# Patient Record
Sex: Female | Born: 1990
Health system: Southern US, Community
[De-identification: ages and names within clinical notes are randomized; demographics above are authoritative.]

---
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
## 2008-03-11 ENCOUNTER — Encounter: Admission: RE | Admit: 2008-03-11 | Discharge: 2008-03-11 | Payer: Self-pay | Admitting: Endocrinology

## 2008-03-18 ENCOUNTER — Encounter: Admission: RE | Admit: 2008-03-18 | Discharge: 2008-03-18 | Payer: Self-pay | Admitting: Endocrinology

## 2008-04-18 IMAGING — NM NM RAI THERAPY FOR HYPERTHYROIDISM
1 series · 1 of 1 positions shown · non-contrast
Comparison: none

CLINICAL DATA: TSH less than 0.004.  24-hour RAI uptake 82.3%, Graves disease.
 RADIOACTIVE IODINE THERAPY FOR HYPERTHYROIDISM:
 Procedure:  The risks and benefits of radioactive iodine therapy were discussed with the patient in detail.  Alternative therapies were also mentioned.  Radiation safety was discussed with the patient, including how to protect the general public from exposure.  There were no barriers to communication.  Written consent was obtained.  The patient then received a capsule containing the radiopharmaceutical.  ([id number])
 The patient will follow-up with the referring physician.
 Radiopharmaceutical:  14.97 mCi O-EBE sodium iodide.

[st statics, dual detec · 1 of 1 slices shown]
[im 1/1]
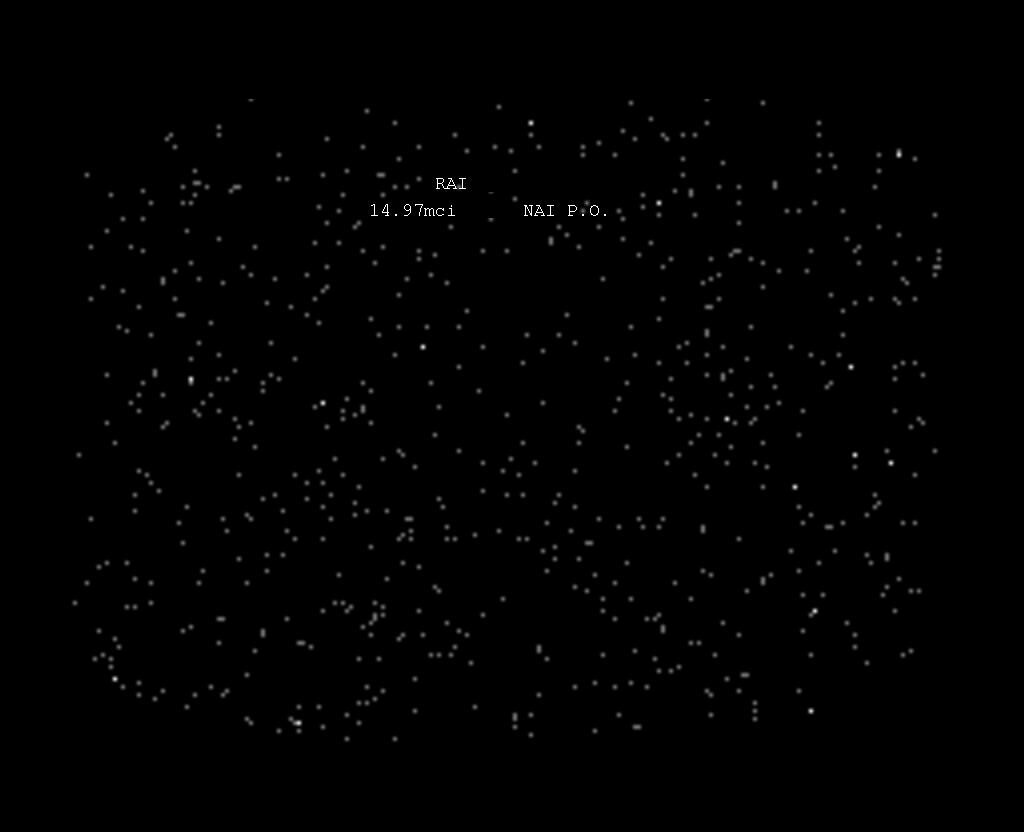

[1 of 1 positions shown; findings below may reference images not displayed]

IMPRESSION: Per oral administration of O-EBE sodium iodide for the treatment of hyperthyroidism.

## 2008-11-21 IMAGING — NM NM THYROID UPTAKE SINGLE (24 HR)
1 series · 1 of 1 positions shown · non-contrast
Comparison: 07/22/2007

CLINICAL DATA: Graves' disease.  The patient underwent F-COC
treatment for hyperthyroidism on 08/08/2007

NUCLEAR MEDICINE 24 HOUR THYROID UPTAKE
TECHNIQUE: Following  per oral administered of F-COC, thyroid
radioiodine uptake was calculated at  24 hours.
Radiopharmaceutical: 12 microcuries F-COC orally

[st statics, dual detec · 1 of 1 slices shown]
[im 1/1]
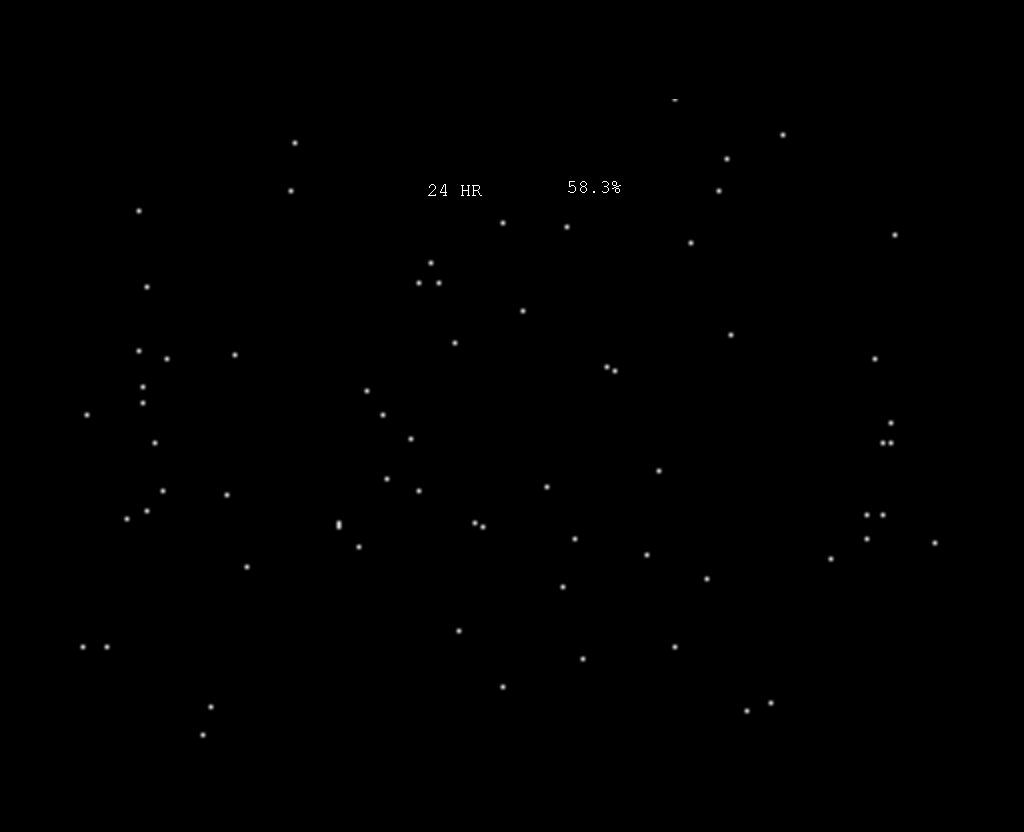

[1 of 1 positions shown; findings below may reference images not displayed]

FINDINGS: Radioactive iodine uptake remains elevated at 58% on
today's study.  Uptake was measured at about 80% on the previous
exam.
IMPRESSION: Elevated radioactive iodine thyroid uptake.

## 2008-11-27 IMAGING — NM NM RAI THERAPY FOR HYPERTHYROIDISM
1 series · 1 of 1 positions shown · non-contrast
Comparison: 08/08/2007

CLINICAL DATA: Graves' disease.  Read treatment.

NUCLEAR MEDICINE RADIOACTIVE IODINE THERAPY FOR HYPERTHYROIDISM
TECHNIQUE: The risks and benefits of radioactive iodine therapy
were discussed with the patient in detail. Alternative therapies
were also mentioned. Radiation safety was discussed with the
patient, including how to protect the general public from exposure.
There were no barriers to communication.  Written consent was
obtained.  The patient then received a capsule containing the
radiopharmaceutical.  The patient will follow-up with the referring
physician.
Radiopharmaceutical: 15.8 mCi I 131

[st statics, dual detec · 2.33mm/px · 1 of 1 slices shown]
[im 1/1]
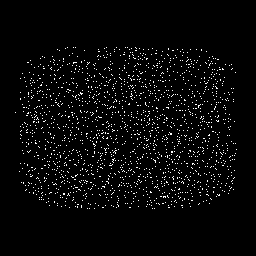

[1 of 1 positions shown; findings below may reference images not displayed]

FINDINGS: None.
IMPRESSION: [  Oral administration of I 131 for hyperthyroidism]

## 2018-01-29 ENCOUNTER — Ambulatory Visit: Payer: Self-pay | Admitting: Emergency Medicine

## 2018-01-29 VITALS — BP 90/65 | HR 59 | Temp 98.3°F | Resp 16 | Wt 160.0 lb

## 2018-01-29 DIAGNOSIS — K122 Cellulitis and abscess of mouth: Secondary | ICD-10-CM

## 2018-01-29 MED ORDER — CLINDAMYCIN HCL 300 MG PO CAPS: 300.0000 mg | ORAL_CAPSULE | Freq: Three times a day (TID) | ORAL | 0 refills | Status: AC

## 2018-01-29 NOTE — Progress Notes (Signed)
S: Nancy Elliott is a 27 y.o. female who presents for evaluation of an oral infection. She has previously been seen by her dentist for this infection and placed on 7 days of Amoxicillin in late January. The lesion cleared on that treatment, however it has reoccurred. She denies pain, fever, chill, n/v/d/ or other systemic symptoms. Her dentist [place] out of the country, she has follow up appointment on March 3rd.   Review of Systems  Constitutional: Negative for chills and fever.  Gastrointestinal: Negative for diarrhea, nausea and vomiting.  Musculoskeletal: Negative for myalgias.   O: Vitals:   01/29/18 1507  BP: 90/65  Pulse: (!) 59  Resp: 16  Temp: 98.3 F (36.8 C)  SpO2: 99%   Physical Exam  Constitutional: She appears well-developed and well-nourished. No distress.  HENT:  Head: Normocephalic and atraumatic.  Mouth/Throat:    Small pustular below the 27th tooth.  Cardiovascular:  Pulses:      Radial pulses are 2+ on the right side.  Lymphadenopathy:       Head (right side): No submental, no submandibular, no tonsillar, no preauricular and no posterior auricular adenopathy present.       Head (left side): No submental, no submandibular, no tonsillar, no preauricular and no posterior auricular adenopathy present.       Right cervical: No superficial cervical, no deep cervical and no posterior cervical adenopathy present.      Left cervical: No superficial cervical, no deep cervical and no posterior cervical adenopathy present.  Skin: Skin is warm. Capillary refill takes less than 2 seconds. She is not diaphoretic.  Nursing note and vitals reviewed.   A: 1. Oral infection     P: 1. Oral infection - Warm salt water washes, follow up with dentist as needed, tylenol or motrin as needed.  - clindamycin (CLEOCIN) 300 MG capsule; Take 1 capsule (300 mg total) by mouth 3 (three) times daily.  Dispense: 21 capsule; Refill: 0

## 2018-01-29 NOTE — Patient Instructions (Signed)
Dental Abscess A dental abscess is pus in or around a tooth. Follow these instructions at home:  Take medicines only as told by your dentist.  If you were prescribed antibiotic medicine, finish all of it even if you start to feel better.  Rinse your mouth (gargle) often with salt water.  Do not drive or use heavy machinery, like a lawn mower, while taking pain medicine.  Do not apply heat to the outside of your mouth.  Keep all follow-up visits as told by your dentist. This is important. Contact a doctor if:  Your pain is worse, and medicine does not help. Get help right away if:  You have a fever or chills.  Your symptoms suddenly get worse.  You have a very bad headache.  You have problems breathing or swallowing.  You have trouble opening your mouth.  You have puffiness (swelling) in your neck or around your eye. This information is not intended to replace advice given to you by your health care provider. Make sure you discuss any questions you have with your health care provider. Document Released: 04/12/2015 Document Revised: 05/03/2016 Document Reviewed: 11/23/2014 [employer] Interactive Patient Education  2018 [employer]  

## 2018-01-31 ENCOUNTER — Telehealth: Payer: Self-pay | Admitting: Emergency Medicine

## 2018-01-31 NOTE — Telephone Encounter (Signed)
Called patient to follow up with her to see how see is doing since her visit with [facility]. She stated she is doing fine and thanked me for the call

## 2020-10-24 ENCOUNTER — Other Ambulatory Visit (HOSPITAL_COMMUNITY): Payer: Self-pay | Admitting: Internal Medicine

## 2020-10-24 MED FILL — FLUARIX QUADRIVALENT 0.5 ML: 0.5 | 1 days supply | Qty: 1 | Fill #0
